# Patient Record
Sex: Female | Born: 1965 | Race: Black or African American | Hispanic: No | Marital: Single | Smoking: Current every day smoker
Health system: Southern US, Community
[De-identification: ages and names within clinical notes are randomized; demographics above are authoritative.]

## PROBLEM LIST (undated history)

## (undated) DIAGNOSIS — F319 Bipolar disorder, unspecified: Secondary | ICD-10-CM

## (undated) DIAGNOSIS — E079 Disorder of thyroid, unspecified: Secondary | ICD-10-CM

## (undated) HISTORY — PX: THYROID SURGERY: SHX805

## (undated) HISTORY — PX: TUBAL LIGATION: SHX77

## (undated) HISTORY — DX: Bipolar disorder, unspecified: F31.9

---

## 2012-02-29 ENCOUNTER — Other Ambulatory Visit: Payer: Self-pay | Admitting: Internal Medicine

## 2012-02-29 DIAGNOSIS — E0500 Thyrotoxicosis with diffuse goiter without thyrotoxic crisis or storm: Secondary | ICD-10-CM

## 2012-02-29 DIAGNOSIS — E049 Nontoxic goiter, unspecified: Secondary | ICD-10-CM

## 2012-03-07 ENCOUNTER — Encounter (HOSPITAL_COMMUNITY)
Admission: RE | Admit: 2012-03-07 | Discharge: 2012-03-07 | Disposition: A | Discharge status: Home / Self Care | Payer: Self-pay | Source: Ambulatory Visit | Attending: Internal Medicine | Admitting: Internal Medicine

## 2012-03-07 DIAGNOSIS — E0500 Thyrotoxicosis with diffuse goiter without thyrotoxic crisis or storm: Secondary | ICD-10-CM | POA: Insufficient documentation

## 2012-03-07 MED ORDER — SODIUM IODIDE I 131 CAPSULE
10.2000 | Freq: Once | INTRAVENOUS | Status: AC | PRN
  Administered 2012-03-07: 10.2 via ORAL

## 2012-03-08 ENCOUNTER — Encounter (HOSPITAL_COMMUNITY)
Admission: RE | Admit: 2012-03-08 | Discharge: 2012-03-08 | Disposition: A | Discharge status: Home / Self Care | Payer: Self-pay | Source: Ambulatory Visit | Attending: Internal Medicine | Admitting: Internal Medicine

## 2012-03-08 DIAGNOSIS — E0500 Thyrotoxicosis with diffuse goiter without thyrotoxic crisis or storm: Secondary | ICD-10-CM | POA: Insufficient documentation

## 2012-03-08 MED ORDER — SODIUM PERTECHNETATE TC 99M INJECTION
10.0000 | Freq: Once | INTRAVENOUS | Status: AC | PRN
  Administered 2012-03-08: 10 via INTRAVENOUS

## 2012-03-08 MED ORDER — SODIUM IODIDE I 131 CAPSULE: 10.2000 | Freq: Once | INTRAVENOUS | Status: AC | PRN

## 2012-03-08 MED ORDER — SODIUM IODIDE I 131 CAPSULE
10.2000 | Freq: Once | INTRAVENOUS | Status: AC | PRN
  Administered 2012-03-07: 10.2 via ORAL

## 2012-03-14 ENCOUNTER — Other Ambulatory Visit: Payer: Self-pay | Admitting: Internal Medicine

## 2012-03-14 DIAGNOSIS — E0590 Thyrotoxicosis, unspecified without thyrotoxic crisis or storm: Secondary | ICD-10-CM

## 2012-03-22 ENCOUNTER — Encounter (HOSPITAL_COMMUNITY)
Admission: RE | Admit: 2012-03-22 | Discharge: 2012-03-22 | Disposition: A | Discharge status: Home / Self Care | Payer: Self-pay | Source: Ambulatory Visit | Attending: Internal Medicine | Admitting: Internal Medicine

## 2012-03-22 DIAGNOSIS — E0590 Thyrotoxicosis, unspecified without thyrotoxic crisis or storm: Secondary | ICD-10-CM | POA: Insufficient documentation

## 2012-03-22 LAB — HCG, SERUM, QUALITATIVE: Preg, Serum: NEGATIVE

## 2012-03-22 MED ORDER — SODIUM IODIDE I 131 CAPSULE
12.6000 | Freq: Once | INTRAVENOUS | Status: AC | PRN
  Administered 2012-03-22: 12.6 via ORAL

## 2012-07-24 ENCOUNTER — Emergency Department (HOSPITAL_BASED_OUTPATIENT_CLINIC_OR_DEPARTMENT_OTHER): Payer: Self-pay

## 2012-07-24 ENCOUNTER — Encounter (HOSPITAL_BASED_OUTPATIENT_CLINIC_OR_DEPARTMENT_OTHER): Payer: Self-pay

## 2012-07-24 ENCOUNTER — Emergency Department (HOSPITAL_BASED_OUTPATIENT_CLINIC_OR_DEPARTMENT_OTHER)
Admission: EM | Admit: 2012-07-24 | Discharge: 2012-07-24 | Disposition: A | Discharge status: Home / Self Care | Payer: Self-pay | Attending: Emergency Medicine | Admitting: Emergency Medicine

## 2012-07-24 DIAGNOSIS — R05 Cough: Secondary | ICD-10-CM | POA: Insufficient documentation

## 2012-07-24 DIAGNOSIS — J3489 Other specified disorders of nose and nasal sinuses: Secondary | ICD-10-CM | POA: Insufficient documentation

## 2012-07-24 DIAGNOSIS — IMO0001 Reserved for inherently not codable concepts without codable children: Secondary | ICD-10-CM | POA: Insufficient documentation

## 2012-07-24 DIAGNOSIS — R059 Cough, unspecified: Secondary | ICD-10-CM | POA: Insufficient documentation

## 2012-07-24 DIAGNOSIS — F17200 Nicotine dependence, unspecified, uncomplicated: Secondary | ICD-10-CM | POA: Insufficient documentation

## 2012-07-24 DIAGNOSIS — R0602 Shortness of breath: Secondary | ICD-10-CM | POA: Insufficient documentation

## 2012-07-24 DIAGNOSIS — R112 Nausea with vomiting, unspecified: Secondary | ICD-10-CM | POA: Insufficient documentation

## 2012-07-24 DIAGNOSIS — E079 Disorder of thyroid, unspecified: Secondary | ICD-10-CM | POA: Insufficient documentation

## 2012-07-24 HISTORY — DX: Disorder of thyroid, unspecified: E07.9

## 2012-07-24 MED ORDER — ALBUTEROL SULFATE (5 MG/ML) 0.5% IN NEBU
5.0000 mg | INHALATION_SOLUTION | Freq: Once | RESPIRATORY_TRACT | Status: AC
  Administered 2012-07-24: 5 mg via RESPIRATORY_TRACT
  Filled 2012-07-24: qty 1

## 2012-07-24 NOTE — ED Provider Notes (Signed)
History     CSN: [id number]  Arrival date & time 07/24/12  1156   First MD Initiated Contact with Patient 07/24/12 1255      Chief Complaint  Patient presents with  . Generalized Body Aches    (Consider location/radiation/quality/duration/timing/severity/associated sxs/prior treatment) HPI  Patient complaining of body aches, nasal congestion, cough, nausea, and vomiting for 3 days. She began with body aches which began 5 days ago. She has not taken any medicine to self treat. She is a current everyday smoker and has continued to smoke. She has some dyspnea but chiefly complains of myalgias. She has been able to keep down fluids today. She has not noted any diarrhea.  Past Medical History  Diagnosis Date  . Thyroid disease     Past Surgical History  Procedure Date  . Thyroid surgery   . Tubal ligation     No family history on file.  History  Substance Use Topics  . Smoking status: Current Every Day Smoker  . Smokeless tobacco: Not on file  . Alcohol Use: Yes    OB History    Grav Para Term Preterm Abortions TAB SAB Ect Mult Living                  Review of Systems  All other systems reviewed and are negative.    Allergies  Review of patient's allergies indicates no known allergies.  Home Medications  No current outpatient prescriptions on file.  BP 135/70  Pulse 82  Temp 99.1 F (37.3 C) (Oral)  Resp 16  Ht 5\' 6"  (1.676 m)  Wt 127 lb (57.607 kg)  BMI 20.50 kg/m2  SpO2 100%  LMP 07/18/2012  Physical Exam  Nursing note and vitals reviewed. Constitutional: She is oriented to person, place, and time. She appears well-developed and well-nourished.  HENT:  Head: Normocephalic and atraumatic.  Right Ear: External ear normal.  Left Ear: External ear normal.  Nose: Nose normal.  Mouth/Throat: Oropharynx is clear and moist.  Eyes: Conjunctivae normal and EOM are normal. Pupils are equal, round, and reactive to light.  Neck: Normal range of motion.  Neck supple.  Cardiovascular: Normal rate, regular rhythm, normal heart sounds and intact distal pulses.   Pulmonary/Chest: Effort normal and breath sounds normal.  Abdominal: Soft. Bowel sounds are normal.  Musculoskeletal: Normal range of motion.  Neurological: She is alert and oriented to person, place, and time. She has normal reflexes.  Skin: Skin is warm and dry.  Psychiatric: She has a normal mood and affect. Her behavior is normal. Thought content normal.    ED Course  Procedures (including critical care time)  Labs Reviewed - No data to display Dg Chest 2 View  07/24/2012  *RADIOLOGY REPORT*  Clinical Data: Cough and cold symptoms.  CHEST - 2 VIEW  Comparison: None  Findings: The cardiac silhouette, mediastinal and hilar contours are normal.  Lungs are clear.  No pleural effusion.  The bony thorax is intact.  IMPRESSION: Normal chest x-Brashears.   Original Report Authenticated By: Rudie Meyer, M.D.      No diagnosis found.    MDM  Patient with normal vital signs and chest x-Fluegel is clear. She is advised to continue oral hydration and Tylenol or Motrin for body aches.       Hilario Quarry, MD 07/24/12 [phone]

## 2012-07-24 NOTE — ED Notes (Signed)
C/o body aches x 1 week

## 2013-03-22 IMAGING — CR DG CHEST 2V
2 series · 2 of 2 positions shown · non-contrast
Comparison: None

CLINICAL DATA: Cough and cold symptoms.

CHEST - 2 VIEW

[w chest pa]
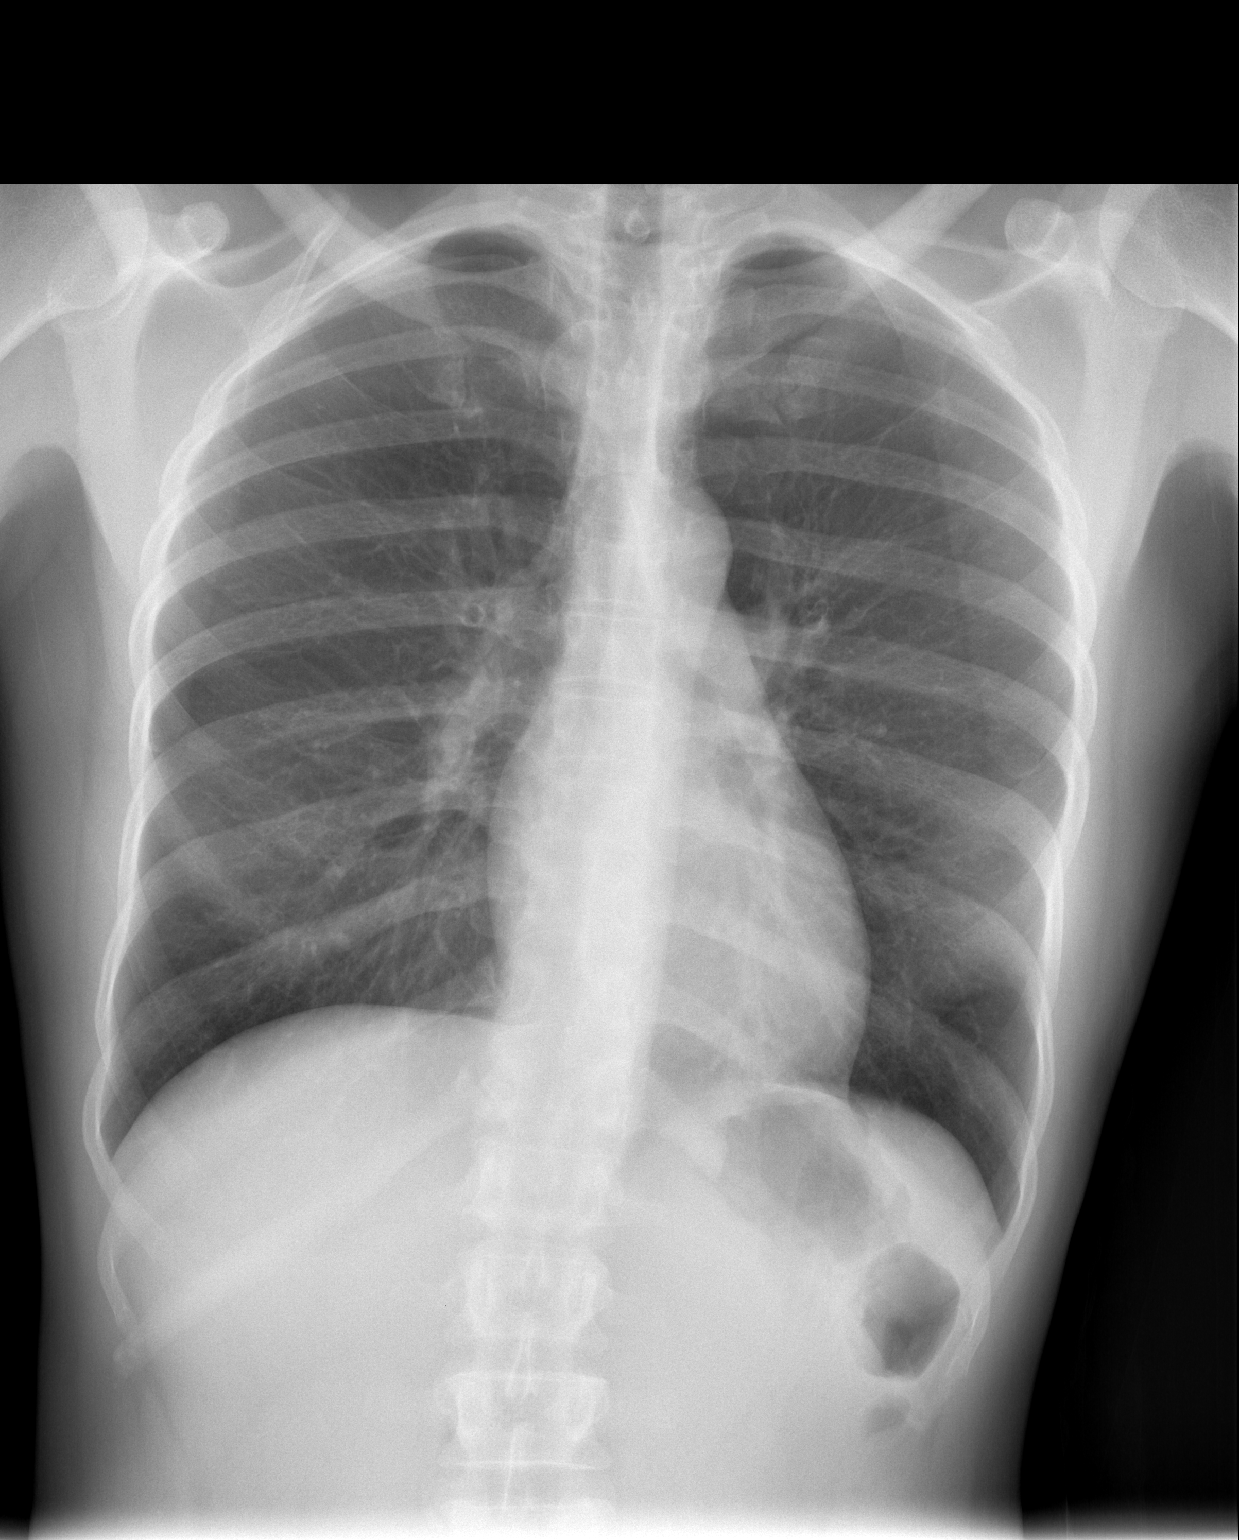

[w chest lat]
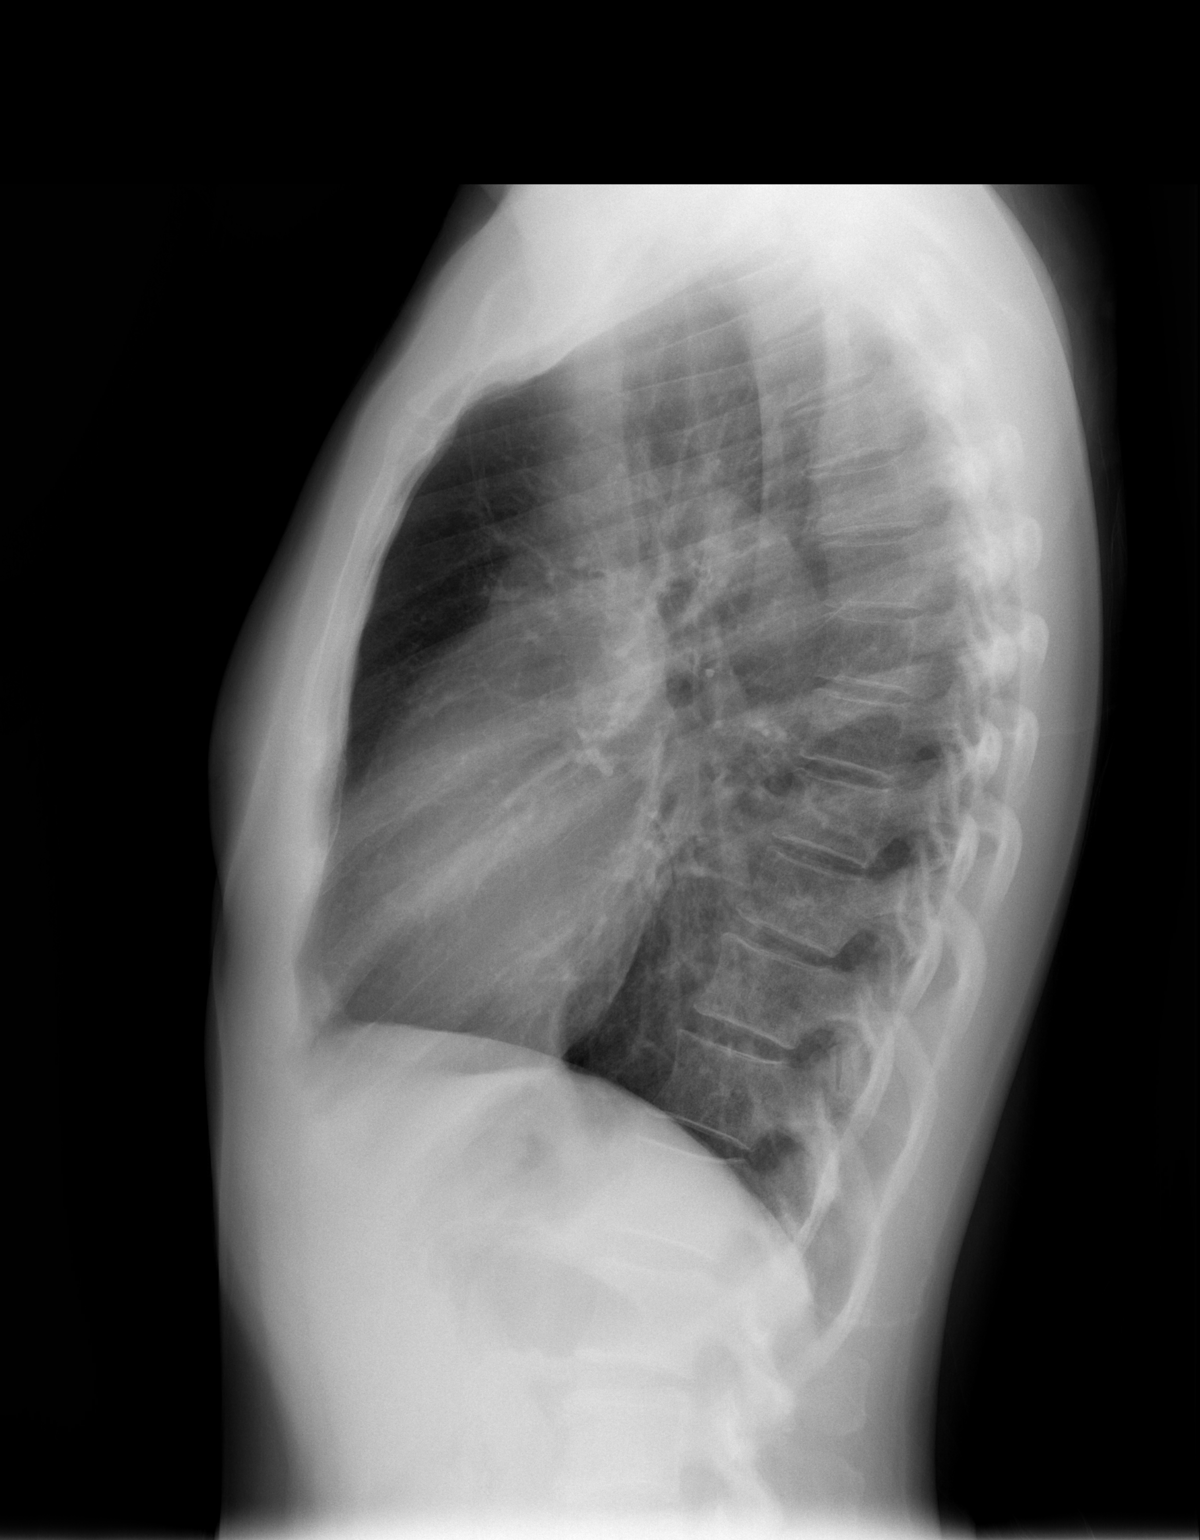

[2 of 2 positions shown; findings below may reference images not displayed]

FINDINGS: The cardiac silhouette, mediastinal and hilar contours
are normal.  Lungs are clear.  No pleural effusion.  The bony
thorax is intact.
IMPRESSION: Normal chest x-ray.

## 2022-04-11 ENCOUNTER — Emergency Department (HOSPITAL_BASED_OUTPATIENT_CLINIC_OR_DEPARTMENT_OTHER)
Admission: EM | Admit: 2022-04-11 | Discharge: 2022-04-11 | Disposition: A | Discharge status: Home / Self Care | Payer: Self-pay | Attending: Emergency Medicine | Admitting: Emergency Medicine

## 2022-04-11 ENCOUNTER — Other Ambulatory Visit: Payer: Self-pay

## 2022-04-11 ENCOUNTER — Encounter (HOSPITAL_BASED_OUTPATIENT_CLINIC_OR_DEPARTMENT_OTHER): Payer: Self-pay

## 2022-04-11 DIAGNOSIS — B029 Zoster without complications: Secondary | ICD-10-CM | POA: Insufficient documentation

## 2022-04-11 HISTORY — DX: Bipolar disorder, unspecified: F31.9

## 2022-04-11 MED ORDER — VALACYCLOVIR HCL 500 MG PO TABS
1000.0000 mg | ORAL_TABLET | Freq: Once | ORAL | Status: AC
  Administered 2022-04-11: 1000 mg via ORAL
  Filled 2022-04-11: qty 2

## 2022-04-11 MED ORDER — VALACYCLOVIR HCL 1 G PO TABS: 1000.0000 mg | ORAL_TABLET | Freq: Three times a day (TID) | ORAL | 0 refills | Status: AC

## 2022-04-11 NOTE — ED Triage Notes (Signed)
Pt states she cut down a tree this weekend, pt now has a a rash on the right side of her abd and back.

## 2022-04-11 NOTE — Discharge Instructions (Signed)
You unfortunately have shingles.  This is treated with antiviral medication.  I have given you a dose here and have prescribed it for you.  Please call your family doctor and have them follow-up with you in the office.  They may choose to have you immunized for shingles after this is improved.  Typically we have you take Tylenol and ibuprofen or naproxen for this.  You can try topical creams and ointments as we have discussed. Max dosing of Tylenol and ibuprofen or naproxen as below.  Take 4 over the counter ibuprofen tablets 3 times a day or 2 over-the-counter naproxen tablets twice a day for pain. Also take tylenol 1000mg (2 extra strength) four times a day.

## 2022-04-11 NOTE — ED Provider Notes (Signed)
[facility] Provider Note   CSN: [id number] Arrival date & time: 04/11/22  1338     History  Chief Complaint  Patient presents with   Rash    Dana Wright is a 56 y.o. female.  56 yo F with a chief complaint of a rash.  This is to the right side of her body.  It occurred after she tried to move a tree branch yesterday.  She thinks maybe she got into something yesterday.  She hired someone to move the tree after she developed this rash.  She tried some Benadryl cream on it without improvement.   Rash      Home Medications Prior to Admission medications   Medication Sig Start Date End Date Taking? Authorizing Provider  valACYclovir (VALTREX) 1000 MG tablet Take 1 tablet (1,000 mg total) by mouth 3 (three) times daily for 7 days. 04/11/22 04/18/22 Yes Deno Etienne, DO      Allergies    Patient has no known allergies.    Review of Systems   Review of Systems  Skin:  Positive for rash.    Physical Exam Updated Vital Signs BP 125/85 (BP Location: Left Arm)   Pulse (!) 105   Temp 99.1 F (37.3 C) (Oral)   Resp 18   Ht 5\' 6"  (1.676 m)   Wt 68.9 kg   LMP 07/18/2012   SpO2 99%   BMI 24.53 kg/m  Physical Exam Vitals and nursing note reviewed.  Constitutional:      General: She is not in acute distress.    Appearance: She is well-developed. She is not diaphoretic.  HENT:     Head: Normocephalic and atraumatic.  Eyes:     Pupils: Pupils are equal, round, and reactive to light.  Cardiovascular:     Rate and Rhythm: Normal rate and regular rhythm.     Heart sounds: No murmur heard.    No friction rub. No gallop.  Pulmonary:     Effort: Pulmonary effort is normal.     Breath sounds: No wheezing or rales.  Abdominal:     General: There is no distension.     Palpations: Abdomen is soft.     Tenderness: There is no abdominal tenderness.  Musculoskeletal:        General: No tenderness.     Cervical back: Normal range of motion and neck  supple.  Skin:    General: Skin is warm and dry.     Comments: Vesicular rash in a dermatomal distribution along the right chest wall.  Stops at the midline.  Neurological:     Mental Status: She is alert and oriented to person, place, and time.  Psychiatric:        Behavior: Behavior normal.     ED Results / Procedures / Treatments   Labs (all labs ordered are listed, but only abnormal results are displayed) Labs Reviewed - No data to display  EKG None  Radiology No results found.  Procedures Procedures    Medications Ordered in ED Medications  valACYclovir (VALTREX) tablet 1,000 mg (has no administration in time range)    ED Course/ Medical Decision Making/ A&P                           Medical Decision Making Risk Prescription drug management.   56 yo F with a chief complaints of a rash.  Clinically the patient has shingles.  We will treat  with antiviral medication.  Have her follow-up with her family doctor in the office.  1:59 PM:  I have discussed the diagnosis/risks/treatment options with the patient.  Evaluation and diagnostic testing in the emergency department does not suggest an emergent condition requiring admission or immediate intervention beyond what has been performed at this time.  They will follow up with PCP. We also discussed returning to the ED immediately if new or worsening sx occur. We discussed the sx which are most concerning (e.g., sudden worsening pain, fever, inability to tolerate by mouth) that necessitate immediate return. Medications administered to the patient during their visit and any new prescriptions provided to the patient are listed below.  Medications given during this visit Medications  valACYclovir (VALTREX) tablet 1,000 mg (has no administration in time range)     The patient appears reasonably screen and/or stabilized for discharge and I doubt any other medical condition or other [facility] requiring further screening, evaluation,  or treatment in the ED at this time prior to discharge.          Final Clinical Impression(s) / ED Diagnoses Final diagnoses:  Herpes zoster without complication    Rx / DC Orders ED Discharge Orders          Ordered    valACYclovir (VALTREX) 1000 MG tablet  3 times daily        04/11/22 1355              Melene Plan, DO 04/11/22 1359
